# Patient Record
Sex: Female | Born: 1975 | Race: White | Hispanic: No | Marital: Married | Smoking: Never smoker
Health system: Southern US, Community
[De-identification: ages and names within clinical notes are randomized; demographics above are authoritative.]

---

## 1998-12-12 ENCOUNTER — Other Ambulatory Visit: Admission: RE | Admit: 1998-12-12 | Discharge: 1998-12-12 | Payer: Self-pay | Admitting: Family Medicine

## 1999-09-13 ENCOUNTER — Other Ambulatory Visit: Admission: RE | Admit: 1999-09-13 | Discharge: 1999-09-13 | Payer: Self-pay | Admitting: Family Medicine

## 1999-09-18 ENCOUNTER — Encounter: Admission: RE | Admit: 1999-09-18 | Discharge: 1999-09-18 | Payer: Self-pay | Admitting: Family Medicine

## 2000-09-02 ENCOUNTER — Encounter: Admission: RE | Admit: 2000-09-02 | Discharge: 2000-09-02 | Payer: Self-pay | Admitting: Surgery

## 2000-09-02 ENCOUNTER — Encounter: Payer: Self-pay | Admitting: Surgery

## 2001-01-15 ENCOUNTER — Inpatient Hospital Stay (HOSPITAL_COMMUNITY): Admission: AD | Admit: 2001-01-15 | Discharge: 2001-01-15 | Payer: Self-pay | Admitting: Obstetrics and Gynecology

## 2001-02-25 ENCOUNTER — Inpatient Hospital Stay (HOSPITAL_COMMUNITY): Admission: AD | Admit: 2001-02-25 | Discharge: 2001-02-28 | Payer: Self-pay | Admitting: Obstetrics & Gynecology

## 2001-04-01 ENCOUNTER — Other Ambulatory Visit: Admission: RE | Admit: 2001-04-01 | Discharge: 2001-04-01 | Payer: Self-pay | Admitting: Obstetrics and Gynecology

## 2001-05-05 ENCOUNTER — Encounter: Admission: RE | Admit: 2001-05-05 | Discharge: 2001-05-05 | Payer: Self-pay | Admitting: Obstetrics and Gynecology

## 2001-05-05 ENCOUNTER — Encounter: Payer: Self-pay | Admitting: Obstetrics and Gynecology

## 2002-01-15 ENCOUNTER — Encounter: Admission: RE | Admit: 2002-01-15 | Discharge: 2002-01-15 | Payer: Self-pay | Admitting: Surgery

## 2002-01-15 ENCOUNTER — Encounter (INDEPENDENT_AMBULATORY_CARE_PROVIDER_SITE_OTHER): Payer: Self-pay | Admitting: *Deleted

## 2002-01-15 ENCOUNTER — Encounter: Payer: Self-pay | Admitting: Surgery

## 2002-01-15 HISTORY — PX: BREAST BIOPSY: SHX20

## 2002-05-11 ENCOUNTER — Other Ambulatory Visit: Admission: RE | Admit: 2002-05-11 | Discharge: 2002-05-11 | Payer: Self-pay | Admitting: Obstetrics & Gynecology

## 2003-07-01 ENCOUNTER — Other Ambulatory Visit: Admission: RE | Admit: 2003-07-01 | Discharge: 2003-07-01 | Payer: Self-pay | Admitting: Obstetrics & Gynecology

## 2003-07-05 ENCOUNTER — Encounter (INDEPENDENT_AMBULATORY_CARE_PROVIDER_SITE_OTHER): Payer: Self-pay | Admitting: *Deleted

## 2003-07-05 ENCOUNTER — Ambulatory Visit (HOSPITAL_COMMUNITY): Admission: RE | Admit: 2003-07-05 | Discharge: 2003-07-05 | Payer: Self-pay | Admitting: Obstetrics and Gynecology

## 2004-04-22 ENCOUNTER — Inpatient Hospital Stay (HOSPITAL_COMMUNITY): Admission: AD | Admit: 2004-04-22 | Discharge: 2004-04-22 | Payer: Self-pay | Admitting: Obstetrics and Gynecology

## 2004-04-27 ENCOUNTER — Inpatient Hospital Stay (HOSPITAL_COMMUNITY): Admission: AD | Admit: 2004-04-27 | Discharge: 2004-04-28 | Payer: Self-pay | Admitting: Obstetrics and Gynecology

## 2004-05-28 ENCOUNTER — Other Ambulatory Visit: Admission: RE | Admit: 2004-05-28 | Discharge: 2004-05-28 | Payer: Self-pay | Admitting: Obstetrics and Gynecology

## 2005-06-12 ENCOUNTER — Other Ambulatory Visit: Admission: RE | Admit: 2005-06-12 | Discharge: 2005-06-12 | Payer: Self-pay | Admitting: Obstetrics and Gynecology

## 2009-07-04 HISTORY — PX: AUGMENTATION MAMMAPLASTY: SUR837

## 2010-08-08 ENCOUNTER — Encounter: Admission: RE | Admit: 2010-08-08 | Discharge: 2010-08-08 | Payer: Self-pay | Admitting: Obstetrics and Gynecology

## 2011-04-26 NOTE — Op Note (Signed)
   NAME:  Jenna Becker, Jenna Becker                     ACCOUNT NO.:  [id number]   MEDICAL RECORD NO.:  [id number]                   PATIENT TYPE:  AMB   LOCATION:  SDC                                  FACILITY:  [facility]   PHYSICIAN:  Malva Limes, M.D.                 DATE OF BIRTH:  May 31, 1976   DATE OF PROCEDURE:  07/05/2003  DATE OF DISCHARGE:                                 OPERATIVE REPORT   PREOPERATIVE DIAGNOSIS:  Missed abortion.   POSTOPERATIVE DIAGNOSIS:  Missed abortion.   PROCEDURE:  Dilation and evacuation.   SURGEON:  Malva Limes, M.D.   ANESTHESIA:  MAC with paracervical block.   ANTIBIOTICS:  Cleocin 900 mg IV x 1.   DRAINS:  Red rubber catheter to the bladder.   SPECIMENS:  Products of conception sent to pathology.   COMPLICATIONS:  None.   ESTIMATED BLOOD LOSS:  20 mL.   INDICATIONS FOR PROCEDURE:  Ms. Woehrle is a 35 year old white female who  was discovered to have abnormal ultrasound consistent with a missed  abortion.  The patient had serial quantitative beta HCGs obtained which were  decreasing.   PROCEDURE:  The patient was taken to the operating room where she was placed  in the dorsal lithotomy position.  MAC anesthesia was administered.  The  patient was prepped with Betadine and draped in the usual fashion for the  procedure.  A sterile speculum was placed in the vagina.  20 mL of 1%  lidocaine was used for a paracervical block.  A single tooth tenaculum was  applied to the anterior cervical lip.  The cervix was serially dilated to a  29 [place].  An 8 mm suction cannula was placed into the uterine cavity.  Products of conception were withdrawn.  Sharp curettage was then performed  followed by repeat suction.  The patient tolerated the procedure well, she  was taken to the recovery room in stable condition.  Instrument and lap  counts were correct x 1.   The patient will be discharged to home.  She will be sent home with Darvocet  to take p.r.n.   She will also be given Cleocin 150 mg p.o. t.i.d. x two  days.  The patient's blood type is Rh negative and, therefore, no RhoGAM is  indicated.                                              Malva Limes, M.D.   MA/MEDQ  D:  07/05/2003  T:  07/05/2003  Job:  [id number]

## 2011-08-05 ENCOUNTER — Other Ambulatory Visit: Payer: Self-pay | Admitting: Obstetrics and Gynecology

## 2015-04-18 ENCOUNTER — Other Ambulatory Visit: Payer: Self-pay | Admitting: Obstetrics and Gynecology

## 2015-04-18 DIAGNOSIS — Z1231 Encounter for screening mammogram for malignant neoplasm of breast: Secondary | ICD-10-CM

## 2015-05-23 ENCOUNTER — Other Ambulatory Visit: Payer: Self-pay | Admitting: Obstetrics and Gynecology

## 2015-05-23 ENCOUNTER — Ambulatory Visit
Admission: RE | Admit: 2015-05-23 | Discharge: 2015-05-23 | Disposition: A | Discharge status: Home / Self Care | Payer: BC Managed Care – PPO | Source: Ambulatory Visit | Attending: Obstetrics and Gynecology | Admitting: Obstetrics and Gynecology

## 2015-05-23 ENCOUNTER — Ambulatory Visit (HOSPITAL_COMMUNITY): Payer: Self-pay

## 2015-05-23 DIAGNOSIS — Z1231 Encounter for screening mammogram for malignant neoplasm of breast: Secondary | ICD-10-CM

## 2016-03-06 ENCOUNTER — Other Ambulatory Visit: Payer: Self-pay | Admitting: Obstetrics and Gynecology

## 2016-03-06 DIAGNOSIS — Z1231 Encounter for screening mammogram for malignant neoplasm of breast: Secondary | ICD-10-CM

## 2016-05-24 ENCOUNTER — Ambulatory Visit
Admission: RE | Admit: 2016-05-24 | Discharge: 2016-05-24 | Disposition: A | Discharge status: Home / Self Care | Payer: BC Managed Care – PPO | Source: Ambulatory Visit | Attending: Obstetrics and Gynecology | Admitting: Obstetrics and Gynecology

## 2016-05-24 DIAGNOSIS — Z1231 Encounter for screening mammogram for malignant neoplasm of breast: Secondary | ICD-10-CM

## 2017-05-01 ENCOUNTER — Other Ambulatory Visit: Payer: Self-pay | Admitting: Nurse Practitioner

## 2017-05-01 DIAGNOSIS — Z1231 Encounter for screening mammogram for malignant neoplasm of breast: Secondary | ICD-10-CM

## 2017-05-26 ENCOUNTER — Ambulatory Visit
Admission: RE | Admit: 2017-05-26 | Discharge: 2017-05-26 | Disposition: A | Discharge status: Home / Self Care | Payer: BC Managed Care – PPO | Source: Ambulatory Visit | Attending: Nurse Practitioner | Admitting: Nurse Practitioner

## 2017-05-26 DIAGNOSIS — Z1231 Encounter for screening mammogram for malignant neoplasm of breast: Secondary | ICD-10-CM

## 2018-05-05 ENCOUNTER — Other Ambulatory Visit: Payer: Self-pay | Admitting: Nurse Practitioner

## 2018-05-05 DIAGNOSIS — Z1231 Encounter for screening mammogram for malignant neoplasm of breast: Secondary | ICD-10-CM

## 2018-05-28 ENCOUNTER — Ambulatory Visit
Admission: RE | Admit: 2018-05-28 | Discharge: 2018-05-28 | Disposition: A | Discharge status: Home / Self Care | Payer: BC Managed Care – PPO | Source: Ambulatory Visit | Attending: Nurse Practitioner | Admitting: Nurse Practitioner

## 2018-05-28 DIAGNOSIS — Z1231 Encounter for screening mammogram for malignant neoplasm of breast: Secondary | ICD-10-CM

## 2019-04-23 ENCOUNTER — Other Ambulatory Visit: Payer: Self-pay | Admitting: Nurse Practitioner

## 2019-04-23 DIAGNOSIS — Z1231 Encounter for screening mammogram for malignant neoplasm of breast: Secondary | ICD-10-CM

## 2019-06-14 ENCOUNTER — Ambulatory Visit: Payer: BC Managed Care – PPO

## 2019-07-21 ENCOUNTER — Ambulatory Visit
Admission: RE | Admit: 2019-07-21 | Discharge: 2019-07-21 | Disposition: A | Discharge status: Home / Self Care | Payer: BC Managed Care – PPO | Source: Ambulatory Visit | Attending: Nurse Practitioner | Admitting: Nurse Practitioner

## 2019-07-21 ENCOUNTER — Other Ambulatory Visit: Payer: Self-pay

## 2019-07-21 DIAGNOSIS — Z1231 Encounter for screening mammogram for malignant neoplasm of breast: Secondary | ICD-10-CM

## 2020-02-03 ENCOUNTER — Ambulatory Visit: Payer: BC Managed Care – PPO | Attending: Internal Medicine

## 2020-02-03 DIAGNOSIS — Z23 Encounter for immunization: Secondary | ICD-10-CM | POA: Insufficient documentation

## 2020-02-03 NOTE — Progress Notes (Signed)
   Covid-19 Vaccination Clinic  Name:  Jenna Becker    MRN: [id number] DOB: [birth year]  02/03/2020  [name] was observed post Covid-19 immunization for 15 minutes without incidence. She was provided with Vaccine Information Sheet and instruction to access the V-Safe system.   [name] was instructed to call 911 with any severe reactions post vaccine: Marland Kitchen Difficulty breathing  . Swelling of your face and throat  . A fast heartbeat  . A bad rash all over your body  . Dizziness and weakness    Immunizations Administered    Name Date Dose VIS Date Route   Pfizer COVID-19 Vaccine 02/03/2020  4:21 PM 0.3 mL 11/19/2019 Intramuscular   Manufacturer: [place]   Lot: [id number]   [place]: [id number]

## 2020-02-29 ENCOUNTER — Ambulatory Visit: Payer: BC Managed Care – PPO | Attending: Internal Medicine

## 2020-02-29 DIAGNOSIS — Z23 Encounter for immunization: Secondary | ICD-10-CM

## 2020-02-29 NOTE — Progress Notes (Signed)
   Covid-19 Vaccination Clinic  Name:  Jenna Becker    MRN: [id number] DOB: [birth year]  02/29/2020  Ms. Jenna Becker was observed post Covid-19 immunization for 15 minutes without incident. She was provided with Vaccine Information Sheet and instruction to access the V-Safe system.   Ms. Jenna Becker was instructed to call 911 with any severe reactions post vaccine: Marland Kitchen Difficulty breathing  . Swelling of face and throat  . A fast heartbeat  . A bad rash all over body  . Dizziness and weakness   Immunizations Administered    Name Date Dose VIS Date Route   Pfizer COVID-19 Vaccine 02/29/2020  3:04 PM 0.3 mL 11/19/2019 Intramuscular   Manufacturer: [place]   Lot: [id number]   [place]: [id number]

## 2020-06-07 ENCOUNTER — Other Ambulatory Visit: Payer: Self-pay | Admitting: Nurse Practitioner

## 2020-06-07 DIAGNOSIS — Z1231 Encounter for screening mammogram for malignant neoplasm of breast: Secondary | ICD-10-CM

## 2020-07-21 ENCOUNTER — Ambulatory Visit
Admission: RE | Admit: 2020-07-21 | Discharge: 2020-07-21 | Disposition: A | Discharge status: Home / Self Care | Payer: BC Managed Care – PPO | Source: Ambulatory Visit

## 2020-07-21 ENCOUNTER — Other Ambulatory Visit: Payer: Self-pay | Admitting: Nurse Practitioner

## 2020-07-21 ENCOUNTER — Other Ambulatory Visit: Payer: Self-pay

## 2020-07-21 DIAGNOSIS — Z1231 Encounter for screening mammogram for malignant neoplasm of breast: Secondary | ICD-10-CM

## 2020-10-21 ENCOUNTER — Ambulatory Visit: Payer: BC Managed Care – PPO

## 2021-06-19 ENCOUNTER — Other Ambulatory Visit: Payer: Self-pay | Admitting: Nurse Practitioner

## 2021-06-19 DIAGNOSIS — Z1231 Encounter for screening mammogram for malignant neoplasm of breast: Secondary | ICD-10-CM

## 2021-08-14 ENCOUNTER — Ambulatory Visit: Payer: BC Managed Care – PPO

## 2021-08-23 ENCOUNTER — Other Ambulatory Visit: Payer: Self-pay

## 2021-08-23 ENCOUNTER — Ambulatory Visit
Admission: RE | Admit: 2021-08-23 | Discharge: 2021-08-23 | Disposition: A | Discharge status: Home / Self Care | Payer: BC Managed Care – PPO | Source: Ambulatory Visit | Attending: Nurse Practitioner | Admitting: Nurse Practitioner

## 2021-08-23 DIAGNOSIS — Z1231 Encounter for screening mammogram for malignant neoplasm of breast: Secondary | ICD-10-CM

## 2022-04-06 ENCOUNTER — Emergency Department (HOSPITAL_BASED_OUTPATIENT_CLINIC_OR_DEPARTMENT_OTHER): Payer: BC Managed Care – PPO | Admitting: Radiology

## 2022-04-06 ENCOUNTER — Emergency Department (HOSPITAL_BASED_OUTPATIENT_CLINIC_OR_DEPARTMENT_OTHER)
Admission: EM | Admit: 2022-04-06 | Discharge: 2022-04-06 | Disposition: A | Discharge status: Home / Self Care | Payer: BC Managed Care – PPO | Attending: Emergency Medicine | Admitting: Emergency Medicine

## 2022-04-06 ENCOUNTER — Other Ambulatory Visit: Payer: Self-pay

## 2022-04-06 ENCOUNTER — Encounter (HOSPITAL_BASED_OUTPATIENT_CLINIC_OR_DEPARTMENT_OTHER): Payer: Self-pay | Admitting: Obstetrics and Gynecology

## 2022-04-06 DIAGNOSIS — S61215A Laceration without foreign body of left ring finger without damage to nail, initial encounter: Secondary | ICD-10-CM | POA: Insufficient documentation

## 2022-04-06 DIAGNOSIS — S61313A Laceration without foreign body of left middle finger with damage to nail, initial encounter: Secondary | ICD-10-CM | POA: Insufficient documentation

## 2022-04-06 DIAGNOSIS — Z23 Encounter for immunization: Secondary | ICD-10-CM | POA: Diagnosis not present

## 2022-04-06 DIAGNOSIS — Y93E5 Activity, floor mopping and cleaning: Secondary | ICD-10-CM | POA: Diagnosis not present

## 2022-04-06 DIAGNOSIS — W28XXXA Contact with powered lawn mower, initial encounter: Secondary | ICD-10-CM | POA: Insufficient documentation

## 2022-04-06 MED ORDER — TETANUS-DIPHTH-ACELL PERTUSSIS 5-2.5-18.5 LF-MCG/0.5 IM SUSY
0.5000 mL | PREFILLED_SYRINGE | Freq: Once | INTRAMUSCULAR | Status: AC
  Administered 2022-04-06: 0.5 mL via INTRAMUSCULAR
  Filled 2022-04-06: qty 0.5

## 2022-04-06 MED ORDER — OXYCODONE-ACETAMINOPHEN 5-325 MG PO TABS: 1.0000 | ORAL_TABLET | Freq: Once | ORAL | Status: DC

## 2022-04-06 MED ORDER — OXYCODONE-ACETAMINOPHEN 5-325 MG PO TABS
1.0000 | ORAL_TABLET | ORAL | Status: DC | PRN
  Administered 2022-04-06: 1 via ORAL
  Filled 2022-04-06: qty 1

## 2022-04-06 MED ORDER — LIDOCAINE HCL (PF) 1 % IJ SOLN
30.0000 mL | Freq: Once | INTRAMUSCULAR | Status: AC
  Administered 2022-04-06: 30 mL
  Filled 2022-04-06 (×2): qty 30

## 2022-04-06 NOTE — ED Provider Notes (Signed)
?[facility] DEPT ?Provider Note ? ? ?CSN: [id number] ?Arrival date & time: 04/06/22  1829 ? ?  ? ?History ? ?Chief Complaint  ?Patient presents with  ? Hand Injury  ? ? ?Jenna Becker is a 46 y.o. female With noncontributory past medical history who presents with concern for injury, laceration to the third and fourth finger of the left hand.  Patient reports that she was cleaning out a Lawnmower when she sustained an injury to the fingers from the lawnmower blades.  She reports her last tetanus was around 18 years ago.  She denies taking any blood thinners.  She is left-hand dominant.  She denies any numbness.  She denies injuries other than to the use isolated fingers. ? ? ?Hand Injury ? ?  ? ?Home Medications ?Prior to Admission medications   ?Not on File  ?   ? ?Allergies    ?Patient has no known allergies.   ? ?Review of Systems   ?Review of Systems  ?Skin:  Positive for wound.  ?All other systems reviewed and are negative. ? ?Physical Exam ?Updated Vital Signs ?BP (!) 143/87 (BP Location: Right Arm)   Pulse 84   Temp 98.2 ?F (36.8 ?C)   Resp 16   Ht 5' 2.5" (1.588 m)   Wt 73.9 kg   LMP 01/16/2022 (Approximate)   SpO2 100%   BMI 29.34 kg/m?  ?Physical Exam ?Vitals and nursing note reviewed.  ?Constitutional:   ?   General: She is not in acute distress. ?   Appearance: Normal appearance.  ?HENT:  ?   Head: Normocephalic and atraumatic.  ?Eyes:  ?   General:     ?   Right eye: No discharge.     ?   Left eye: No discharge.  ?Cardiovascular:  ?   Rate and Rhythm: Normal rate and regular rhythm.  ?Pulmonary:  ?   Effort: Pulmonary effort is normal. No respiratory distress.  ?Musculoskeletal:     ?   General: No deformity.  ?   Comments: Intact strength to flexion and extension of affected phalanges. No evidence of tendon laceration or rupture.  ?Skin: ?   General: Skin is warm and dry.  ?   Comments: Patient with significant laceration injuries at the distal phalanx of the left  3rd and 4th fingers. 3rd finger with two discrete lacerations, one with questionable nail damage, and the other just proximal on the pad of the finger. Lacerations are both approx 1.5cm in length. No foreign bodies noted. Laceration of 4th finger with near complete removal of a thin layer of skin at the tip of the finger, small area of preserved skin flap that may benefit from minor repair.  ?Neurological:  ?   Mental Status: She is alert and oriented to person, place, and time.  ?Psychiatric:     ?   Mood and Affect: Mood normal.     ?   Behavior: Behavior normal.  ? ? ?ED Results / Procedures / Treatments   ?Labs ?(all labs ordered are listed, but only abnormal results are displayed) ?Labs Reviewed - No data to display ? ?EKG ?None ? ?Radiology ?DG Hand Complete Left ? ?Result Date: 04/06/2022 ?CLINICAL DATA:  Trauma EXAM: LEFT HAND - COMPLETE 3+ VIEW COMPARISON:  None. FINDINGS: No fracture or dislocation is seen. There is 1 mm smooth marginated calcification adjacent to the base of proximal phalanx of thumb, possibly old avulsion. There is soft tissue deformity in the tips of left  middle and ring fingers. There are no opaque foreign bodies. IMPRESSION: No recent fracture or dislocation is seen in the left hand. Electronically Signed   By: Elmer Picker M.D.   On: 04/06/2022 19:44   ? ?Procedures ?Marland Kitchen.Laceration Repair ? ?Date/Time: 04/07/2022 10:20 AM ?Performed by: Anselmo Pickler, PA-C ?Authorized by: Anselmo Pickler, PA-C  ? ?Consent:  ?  Consent obtained:  Verbal ?  Consent given by:  Patient ?  Risks, benefits, and alternatives were discussed: yes   ?  Risks discussed:  Pain, infection, poor cosmetic result, need for additional repair and poor wound healing ?  Alternatives discussed:  No treatment ?Universal protocol:  ?  Procedure explained and questions answered to patient or proxy's satisfaction: yes   ?  Patient identity confirmed:  Verbally with patient ?Anesthesia:  ?  Anesthesia method:   Nerve block ?  Block location:  Ring block 3rd finger ?  Block needle gauge:  25 G ?  Block anesthetic:  Lidocaine 1% w/o epi ?  Block technique:  Modified transthecal ?  Block injection procedure:  Anatomic landmarks identified, introduced needle and negative aspiration for blood ?  Block outcome:  Anesthesia achieved ?Laceration details:  ?  Location:  Finger ?  Finger location:  L long finger ?  Length (cm):  1.5 ?  Depth (mm):  3 ?Treatment:  ?  Area cleansed with:  Shur-Clens ?  Amount of cleaning:  Standard ?Skin repair:  ?  Repair method:  Sutures ?  Suture size:  6-0 ?  Suture material:  Prolene ?  Suture technique:  Simple interrupted ?  Number of sutures:  5 ?Approximation:  ?  Approximation:  Close ?Repair type:  ?  Repair type:  Simple ?Post-procedure details:  ?  Dressing:  Bulky dressing ?  Procedure completion:  Tolerated ?Marland Kitchen.Laceration Repair ? ?Date/Time: 04/07/2022 10:21 AM ?Performed by: Anselmo Pickler, PA-C ?Authorized by: Anselmo Pickler, PA-C  ? ?Consent:  ?  Consent obtained:  Verbal ?  Consent given by:  Patient ?  Risks, benefits, and alternatives were discussed: yes   ?  Risks discussed:  Infection, pain, poor cosmetic result, need for additional repair and poor wound healing ?  Alternatives discussed:  No treatment ?Universal protocol:  ?  Procedure explained and questions answered to patient or proxy's satisfaction: yes   ?  Patient identity confirmed:  Verbally with patient ?Anesthesia:  ?  Anesthesia method:  Nerve block ?  Block location:  Ring block ?  Block needle gauge:  25 G ?  Block anesthetic:  Lidocaine 1% w/o epi ?  Block technique:  Modified transthecal ?  Block injection procedure:  Anatomic landmarks identified, introduced needle, negative aspiration for blood and anatomic landmarks palpated ?  Block outcome:  Anesthesia achieved ?Laceration details:  ?  Location:  Finger ?  Finger location:  L long finger ?  Length (cm):  1.4 ?  Depth (mm):  3 ?Treatment:  ?   Area cleansed with:  Shur-Clens ?  Amount of cleaning:  Standard ?Skin repair:  ?  Repair method:  Sutures ?  Suture size:  6-0 ?  Suture material:  Prolene ?  Suture technique:  Simple interrupted ?  Number of sutures:  2 ?Approximation:  ?  Approximation:  Loose ?Repair type:  ?  Repair type:  Simple ?Post-procedure details:  ?  Dressing:  Bulky dressing ?  Procedure completion:  Tolerated ?Comments:  ?   Laceration at margin of  nail, unable to full approximate wound edges due to the nature of the injury, two sutures placed to help approximate wound edges.  ?..Laceration Repair ? ?Date/Time: 04/07/2022 10:22 AM ?Performed by: Anselmo Pickler, PA-C ?Authorized by: Anselmo Pickler, PA-C  ? ?Consent:  ?  Consent obtained:  Verbal ?  Consent given by:  Patient ?  Risks, benefits, and alternatives were discussed: yes   ?  Risks discussed:  Infection, pain, poor cosmetic result, need for additional repair and poor wound healing ?  Alternatives discussed:  No treatment ?Universal protocol:  ?  Procedure explained and questions answered to patient or proxy's satisfaction: yes   ?  Patient identity confirmed:  Verbally with patient ?Anesthesia:  ?  Anesthesia method:  Nerve block ?  Block location:  Ring block ?  Block needle gauge:  25 G ?  Block technique:  Modified transthecal ?  Block injection procedure:  Anatomic landmarks identified, introduced needle, anatomic landmarks palpated and negative aspiration for blood ?  Block outcome:  Anesthesia achieved ?Laceration details:  ?  Location:  Finger ?  Finger location:  L ring finger ?  Length (cm):  2 ?  Depth (mm):  4 ?Treatment:  ?  Area cleansed with:  Shur-Clens ?Skin repair:  ?  Repair method:  Sutures ?  Suture size:  6-0 ?  Suture material:  Prolene ?  Suture technique:  Simple interrupted ?  Number of sutures:  1 ?Approximation:  ?  Approximation:  Loose ?Repair type:  ?  Repair type:  Simple ?Post-procedure details:  ?  Dressing:  Bulky dressing ?   Procedure completion:  Tolerated ?Comments:  ?   This wound was largely open and left to heal by secondary intention. Small skin flap at margin of wound was tacked down with limited suture. All wounds described ba

## 2022-04-06 NOTE — ED Triage Notes (Signed)
Patient reports to the ER for hand injury by lawnmower. Patient reports she sliced her fingers by accident on the left hand ?

## 2022-04-06 NOTE — Discharge Instructions (Signed)
Please use Tylenol or ibuprofen for pain.  You may use 600 mg ibuprofen every 6 hours or 1000 mg of Tylenol every 6 hours.  You may choose to alternate between the 2.  This would be most effective.  Not to exceed 4 g of Tylenol within 24 hours.  Not to exceed 3200 mg ibuprofen 24 hours. ? ?Please leave the bandage that I put in place for 24 hours.  We remove it please clean the areas gently with soap and water.  Place a thin layer of Polysporin or Neosporin, and impregnated Vaseline gauze especially over the open wound finger.  You have any concerns about wound healing, infection please return for further evaluation, or follow-up with a hand surgeon whose information I have placed on your discharge instructions.  They may need to continue to be bandaged for 2 to 3 weeks before they are healed due to the nature of these injuries. ?

## 2022-06-10 LAB — COLOGUARD: COLOGUARD: NEGATIVE

## 2022-06-10 LAB — EXTERNAL GENERIC LAB PROCEDURE: COLOGUARD: NEGATIVE

## 2022-09-02 ENCOUNTER — Other Ambulatory Visit: Payer: Self-pay | Admitting: Nurse Practitioner

## 2022-09-02 DIAGNOSIS — Z1231 Encounter for screening mammogram for malignant neoplasm of breast: Secondary | ICD-10-CM

## 2022-10-01 ENCOUNTER — Ambulatory Visit
Admission: RE | Admit: 2022-10-01 | Discharge: 2022-10-01 | Disposition: A | Discharge status: Home / Self Care | Payer: BC Managed Care – PPO | Source: Ambulatory Visit | Attending: Nurse Practitioner | Admitting: Nurse Practitioner

## 2022-10-01 DIAGNOSIS — Z1231 Encounter for screening mammogram for malignant neoplasm of breast: Secondary | ICD-10-CM

## 2022-11-29 IMAGING — MG DIGITAL SCREENING BREAST BILAT IMPLANT W/ TOMO W/ CAD
8 of 12 series · 8 of 28 positions shown · non-contrast
Comparison: Previous exam(s).

CLINICAL DATA: Screening.

EXAM:
DIGITAL SCREENING BILATERAL MAMMOGRAM WITH IMPLANTS, CAD AND
TOMOSYNTHESIS
TECHNIQUE: Bilateral screening digital craniocaudal and mediolateral oblique
mammograms were obtained. Bilateral screening digital breast
tomosynthesis was performed. The images were evaluated with
computer-aided detection. Standard and/or implant displaced views
were performed.

[R MLO]
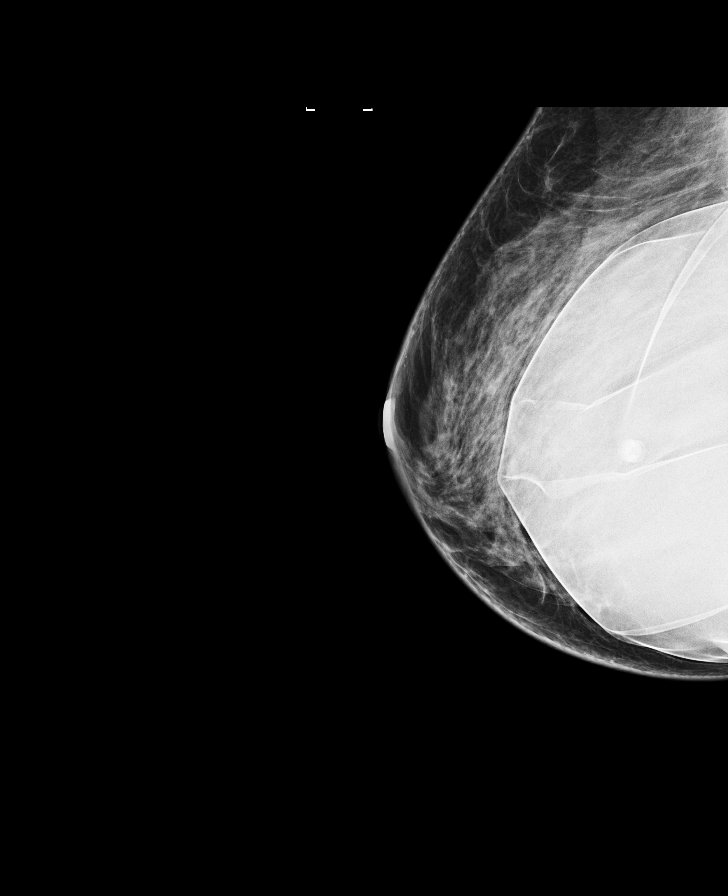

[L CC]
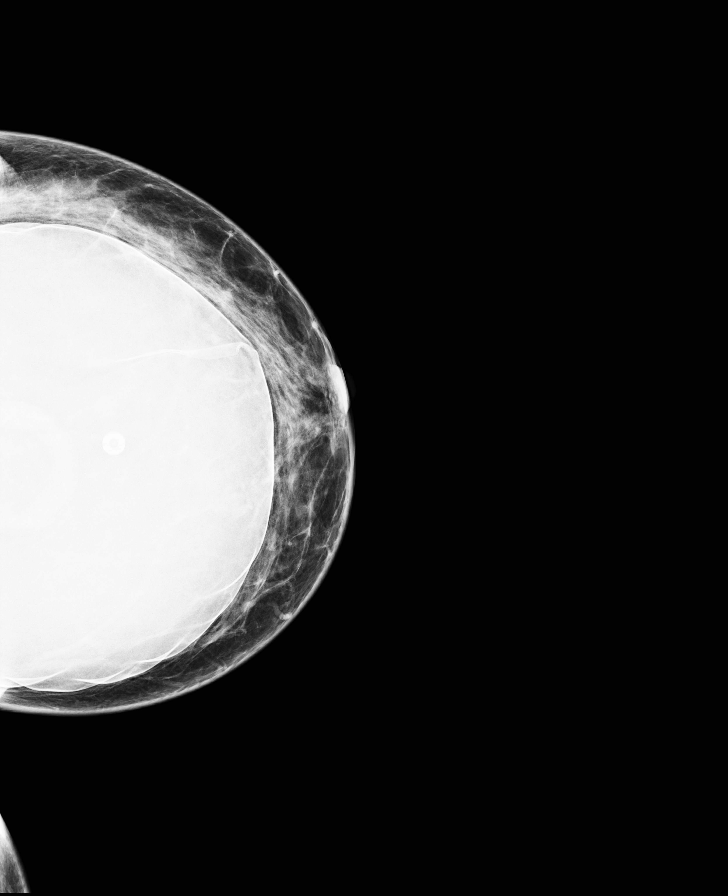

[L MLO]
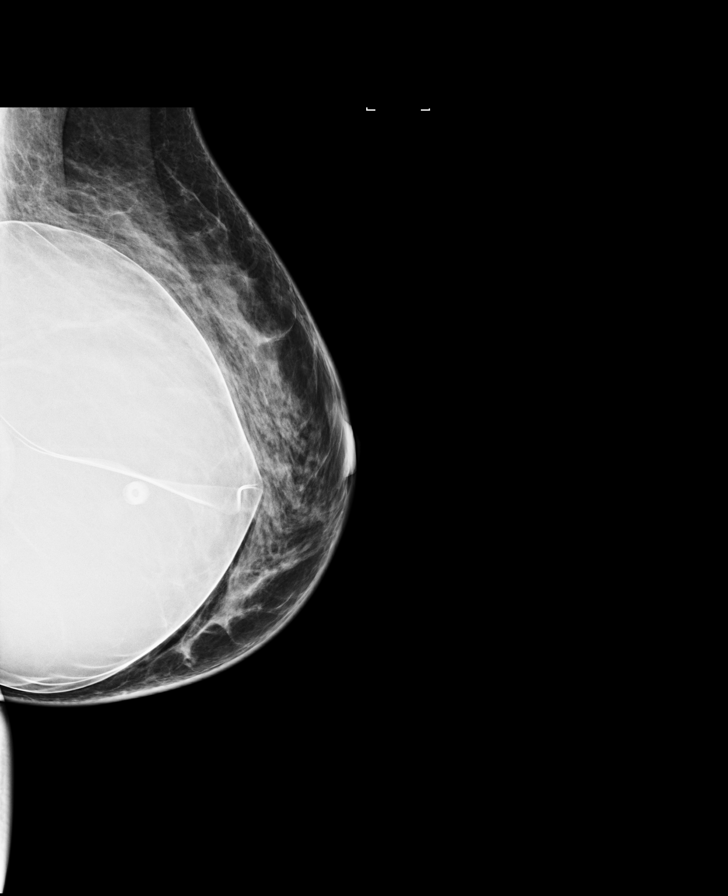

[R CC]
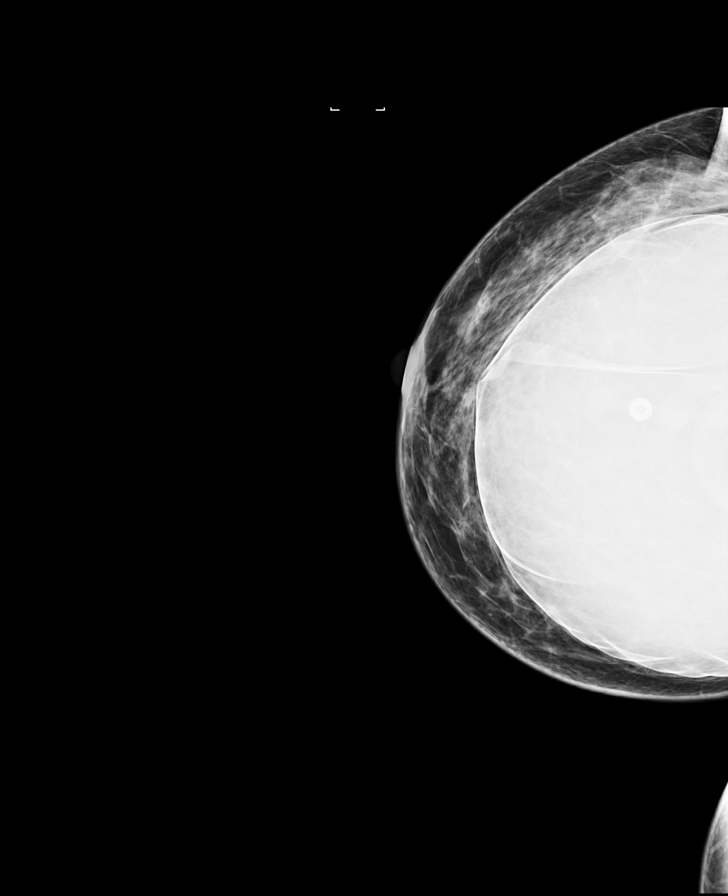

[L CC synth-2D]
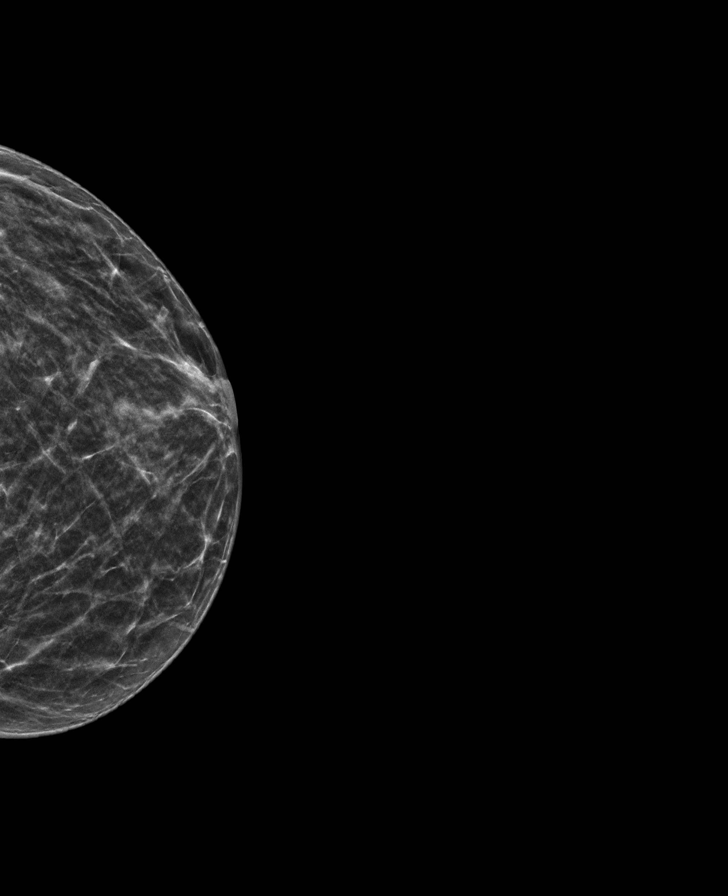

[R CC synth-2D]
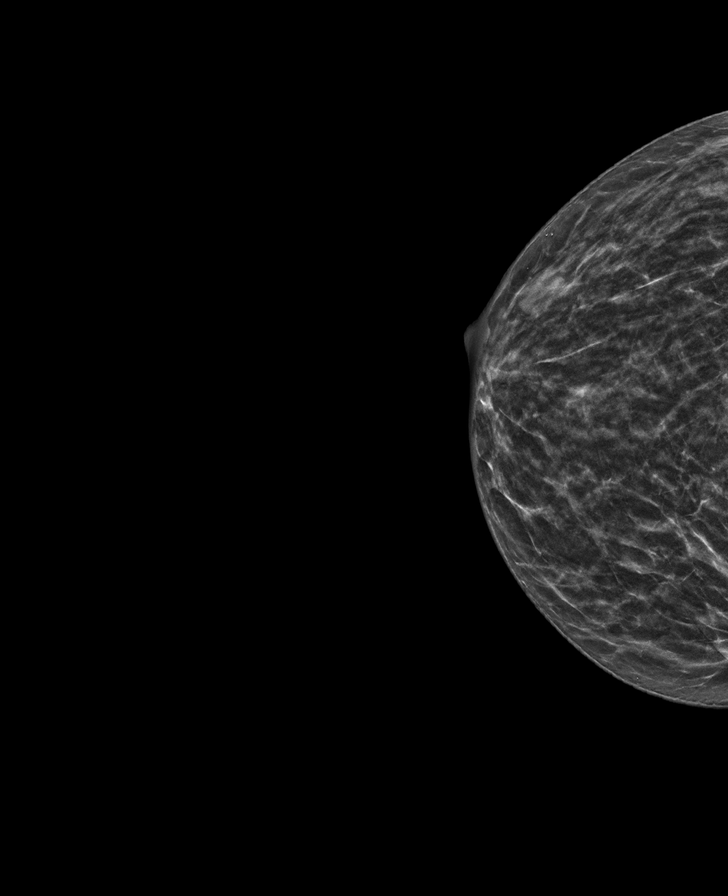

[R MLO synth-2D]
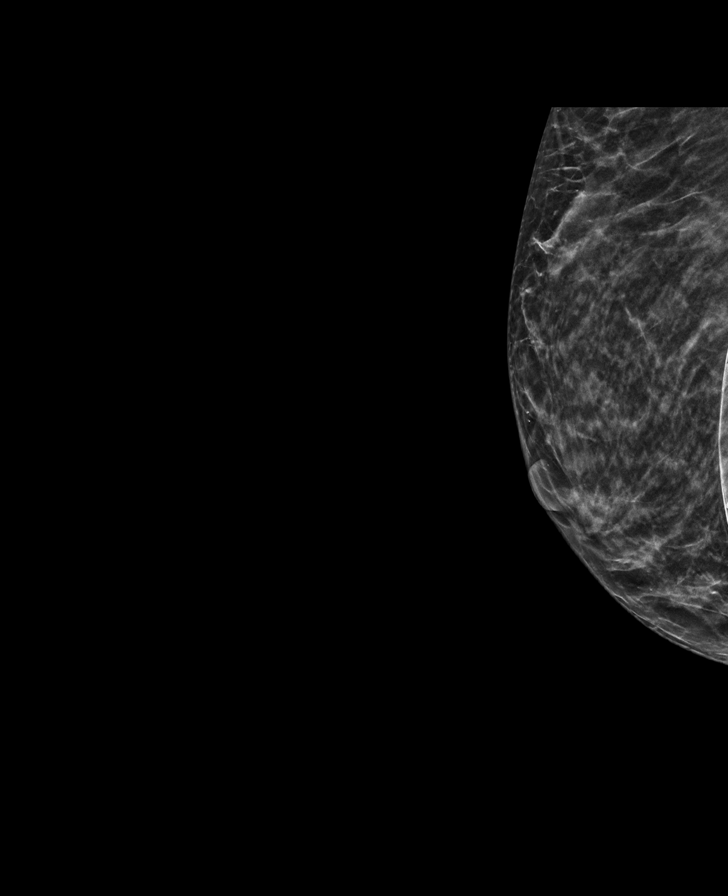

[L MLO synth-2D]
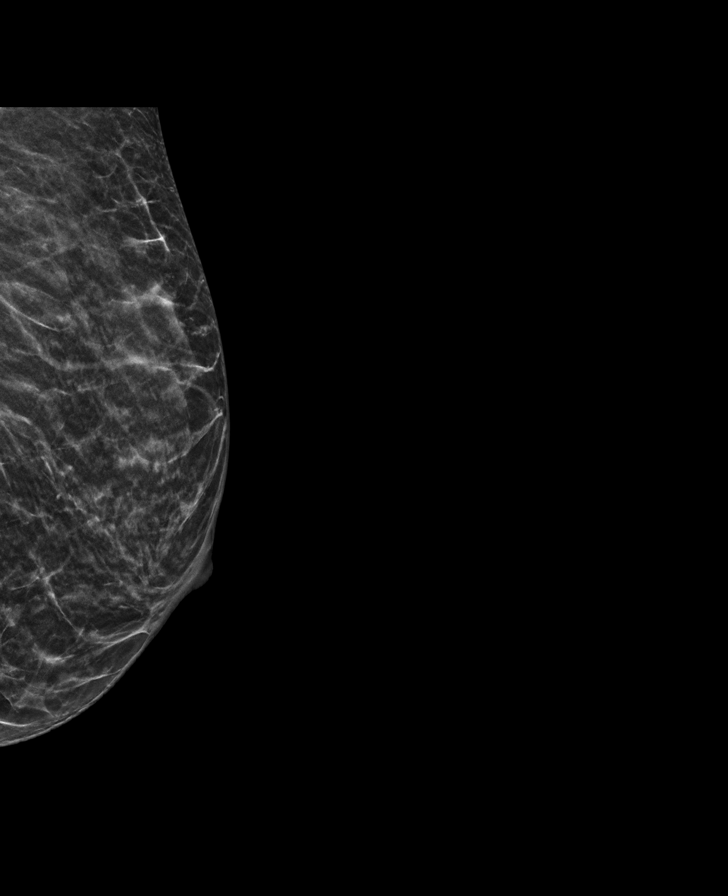

[8 of 28 positions shown; findings below may reference images not displayed]

ACR Breast Density Category c: The breast tissue is heterogeneously
dense, which may obscure small masses.
FINDINGS: The patient has retropectoral implants. There are no findings
suspicious for malignancy.
IMPRESSION: No mammographic evidence of malignancy. A result letter of this
screening mammogram will be mailed directly to the patient.

RECOMMENDATION:
Screening mammogram in one year. (Code:LT-E-7TH)

BI-RADS CATEGORY  1:  Negative.

## 2023-07-13 IMAGING — DX DG HAND COMPLETE 3+V*L*
3 series · 3 of 3 positions shown · non-contrast
Comparison: None.

CLINICAL DATA: Trauma

EXAM:
LEFT HAND - COMPLETE 3+ VIEW

[hand ap]
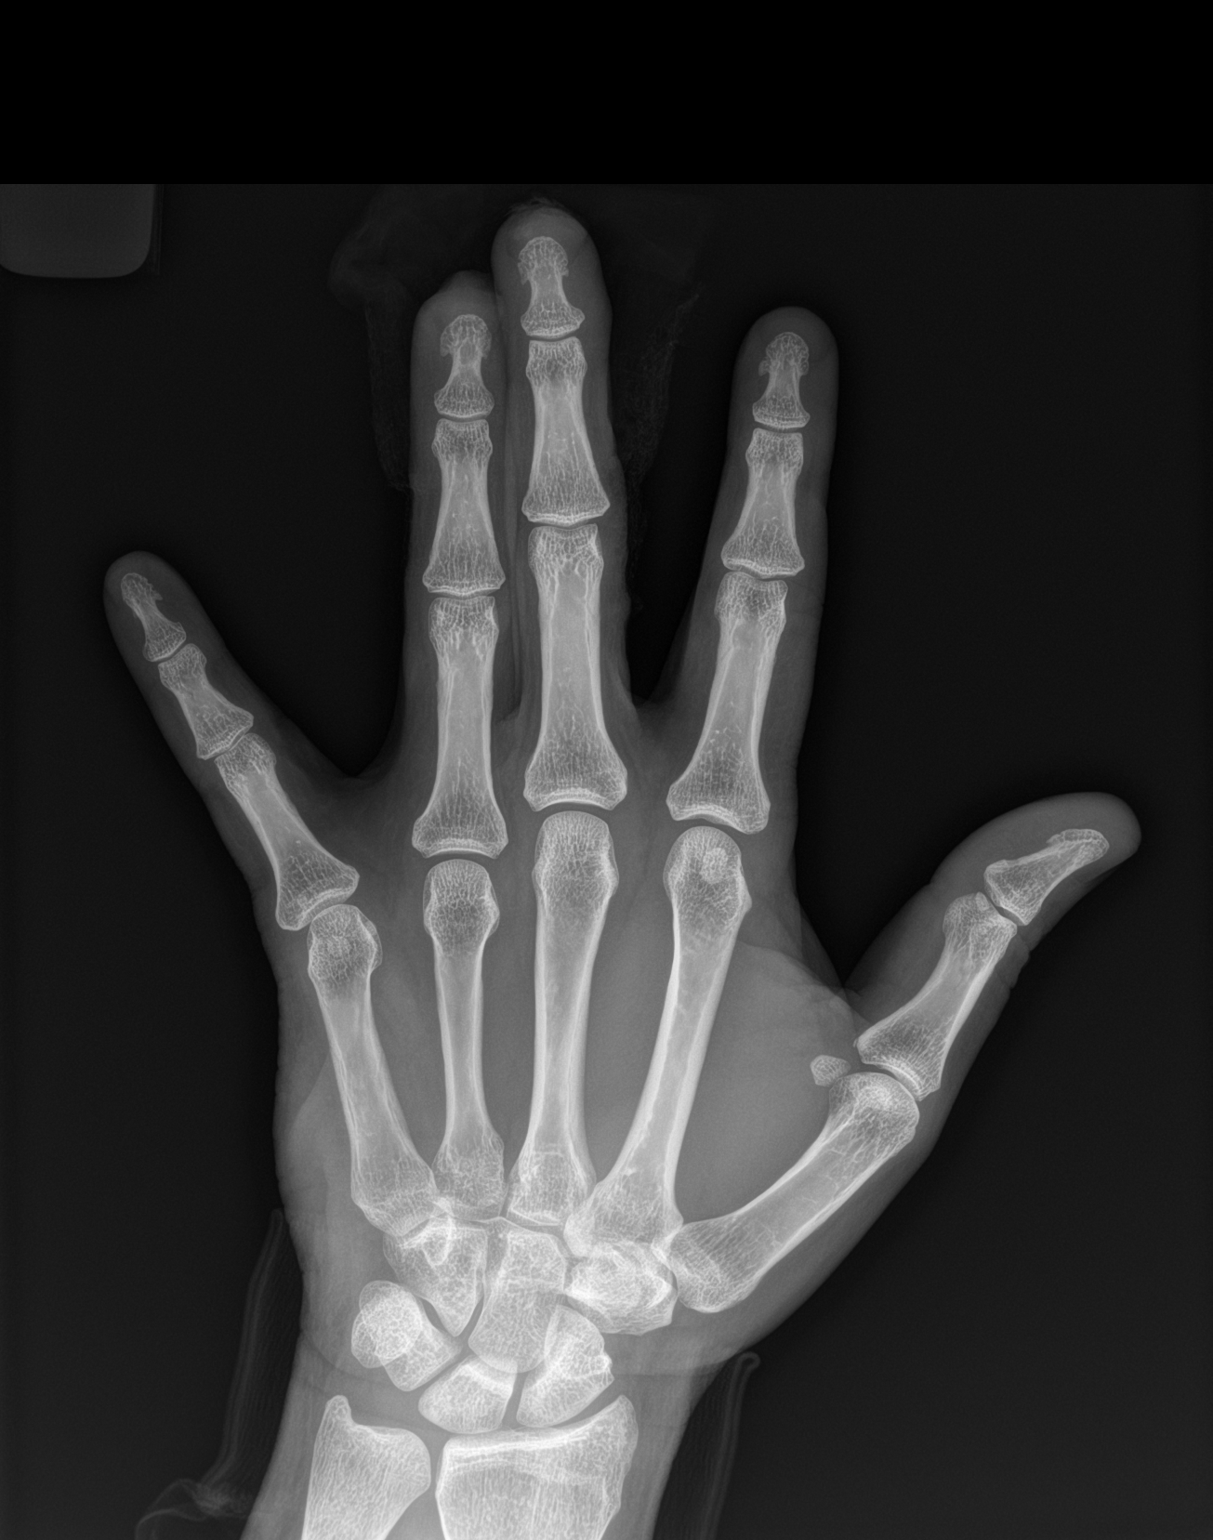

[hand obl]
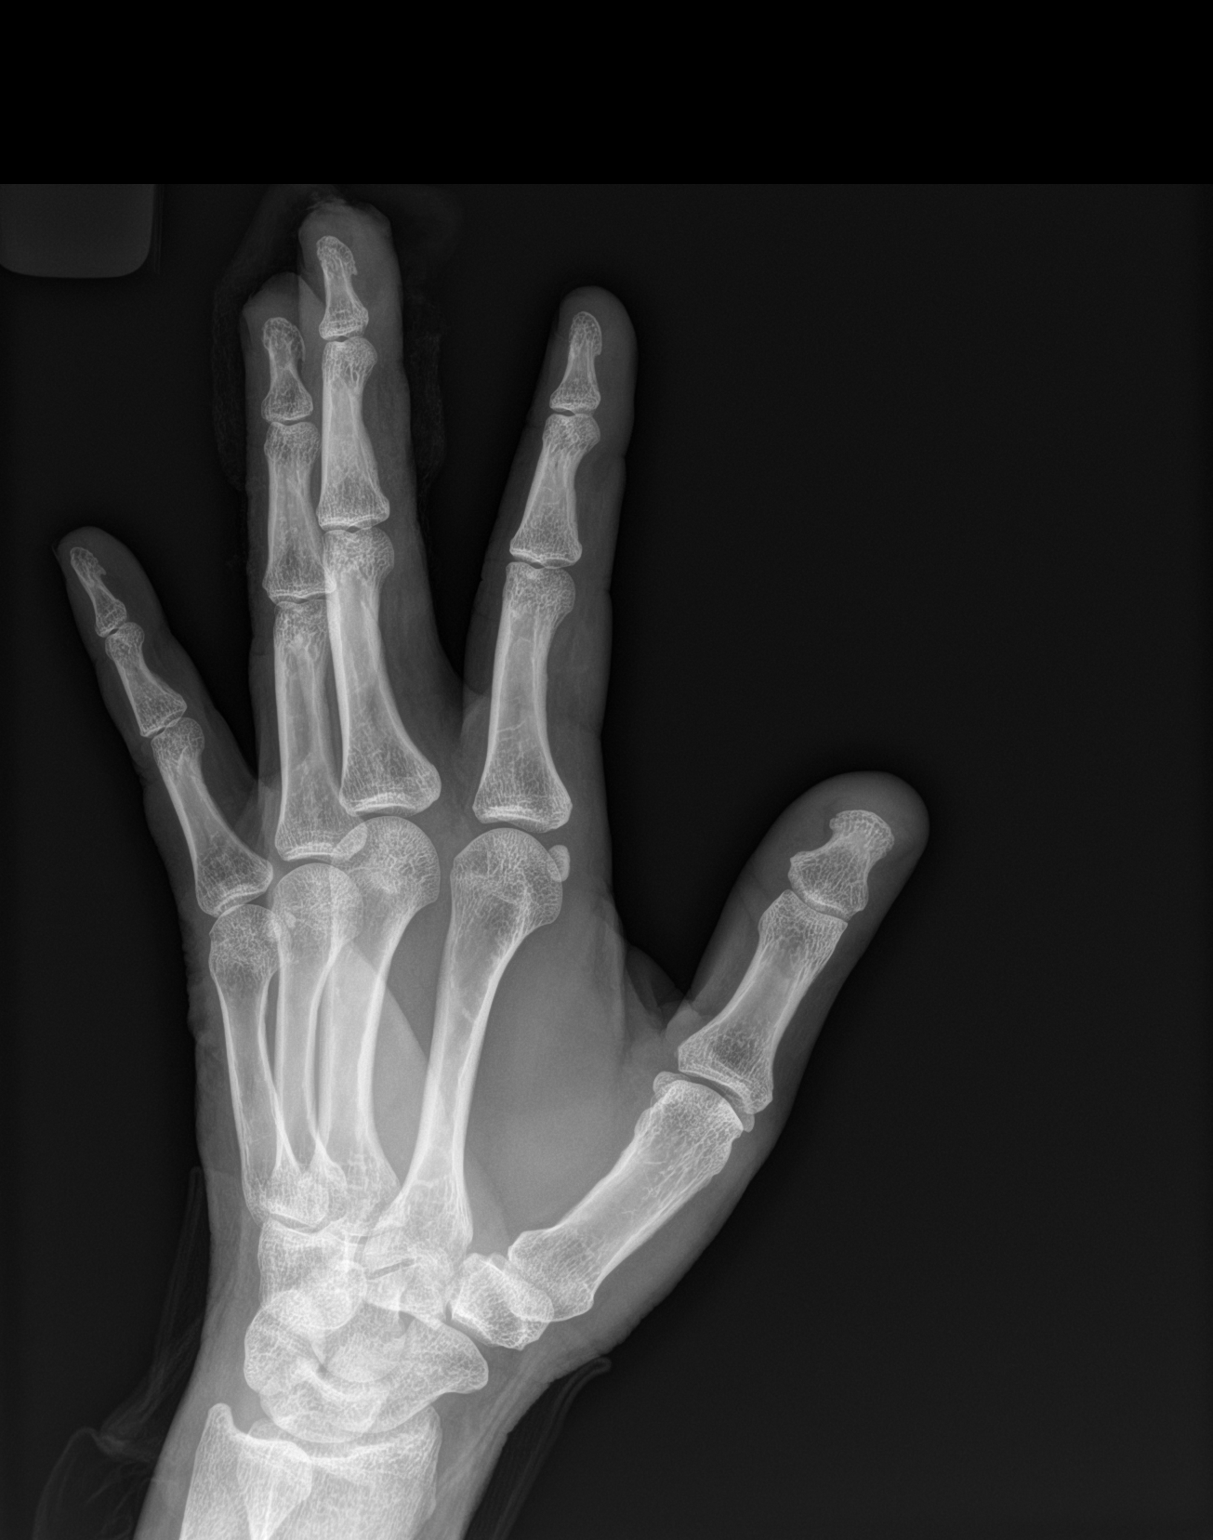

[hand lat]
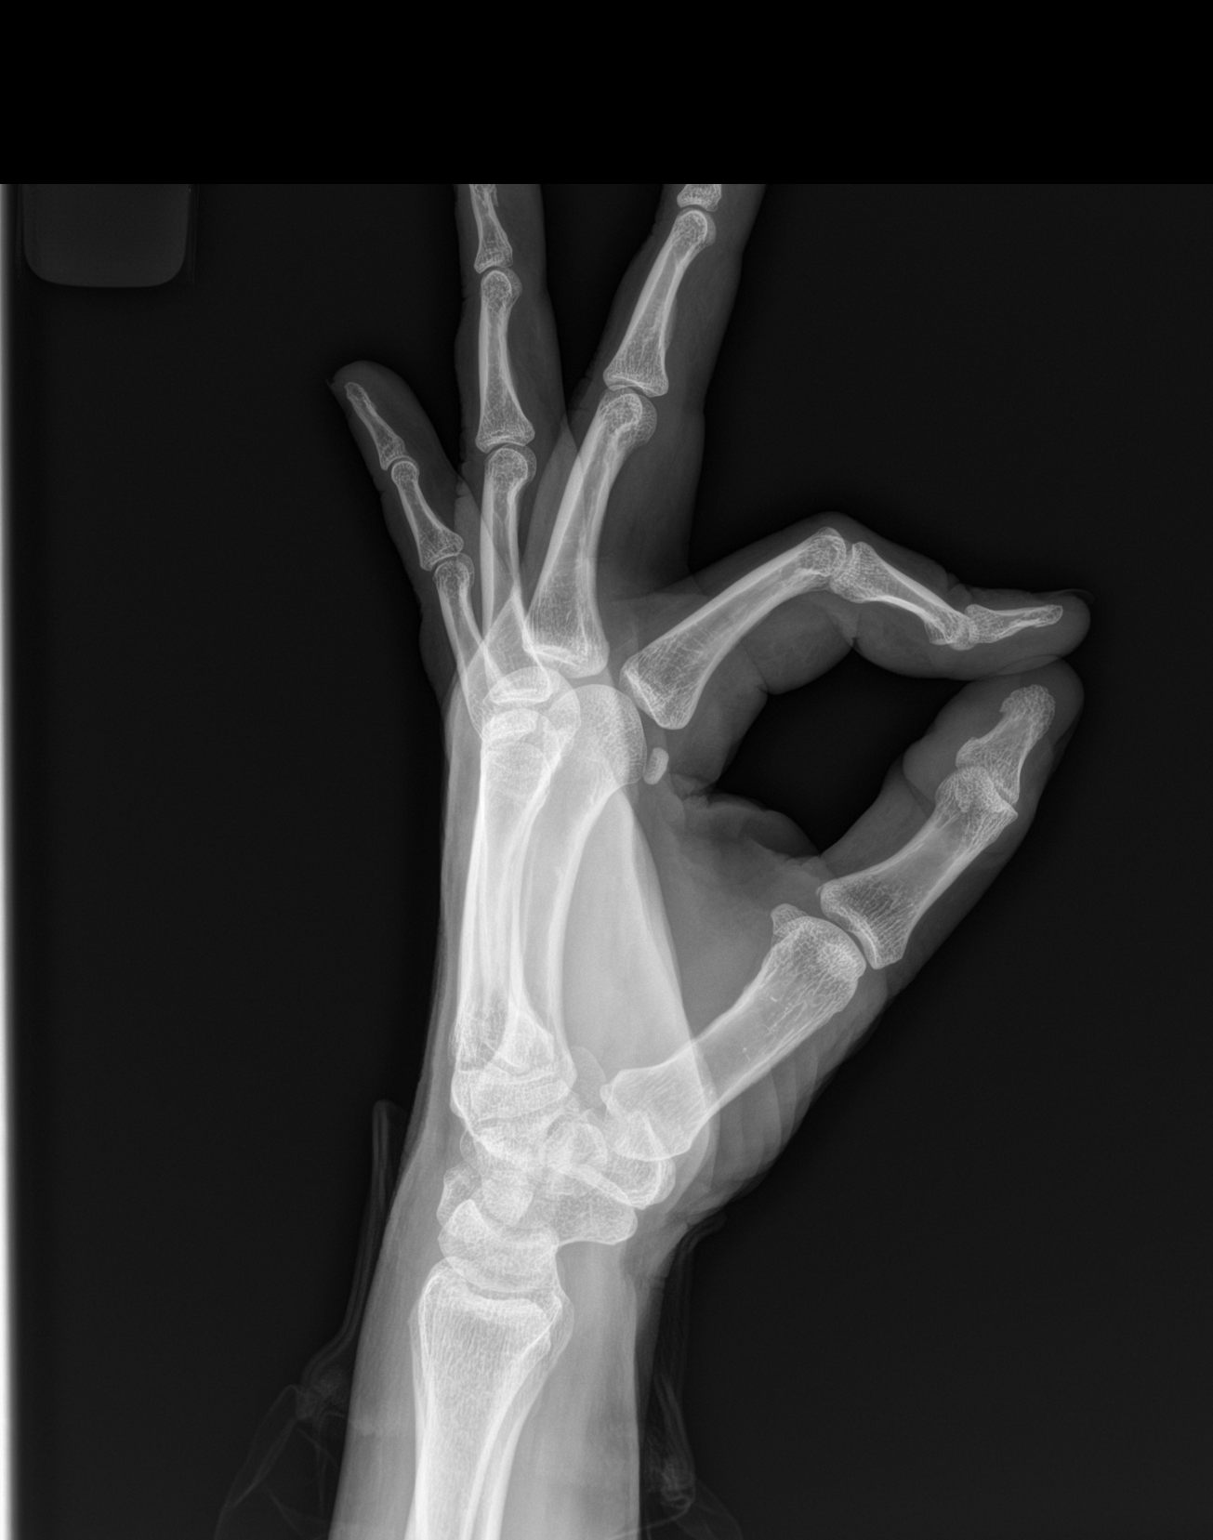

[3 of 3 positions shown; findings below may reference images not displayed]

FINDINGS: No fracture or dislocation is seen. There is 1 mm smooth marginated
calcification adjacent to the base of proximal phalanx of thumb,
possibly old avulsion. There is soft tissue deformity in the tips of
left middle and ring fingers. There are no opaque foreign bodies.
IMPRESSION: No recent fracture or dislocation is seen in the left hand.

## 2023-10-03 ENCOUNTER — Other Ambulatory Visit: Payer: Self-pay | Admitting: Family Medicine

## 2023-10-03 DIAGNOSIS — Z1231 Encounter for screening mammogram for malignant neoplasm of breast: Secondary | ICD-10-CM

## 2023-10-30 ENCOUNTER — Ambulatory Visit
Admission: RE | Admit: 2023-10-30 | Discharge: 2023-10-30 | Disposition: A | Discharge status: Home / Self Care | Payer: BC Managed Care – PPO | Source: Ambulatory Visit | Attending: Family Medicine | Admitting: Family Medicine

## 2023-10-30 DIAGNOSIS — Z1231 Encounter for screening mammogram for malignant neoplasm of breast: Secondary | ICD-10-CM

## 2024-11-01 ENCOUNTER — Other Ambulatory Visit: Payer: Self-pay | Admitting: Nurse Practitioner

## 2024-11-01 ENCOUNTER — Ambulatory Visit
Admission: RE | Admit: 2024-11-01 | Discharge: 2024-11-01 | Disposition: A | Discharge status: Home / Self Care | Source: Ambulatory Visit | Attending: Family Medicine | Admitting: Family Medicine

## 2024-11-01 ENCOUNTER — Other Ambulatory Visit: Payer: Self-pay | Admitting: Family Medicine

## 2024-11-01 DIAGNOSIS — Z1231 Encounter for screening mammogram for malignant neoplasm of breast: Secondary | ICD-10-CM
